# Patient Record
Sex: Female | Born: 1955
Health system: Southern US, Community
[De-identification: ages and names within clinical notes are randomized; demographics above are authoritative.]

## PROBLEM LIST (undated history)

## (undated) HISTORY — PX: COLONOSCOPY: SHX174

## (undated) HISTORY — PX: HAND SURGERY: SHX662

## (undated) HISTORY — PX: OTHER SURGICAL HISTORY: SHX169

---

## 2001-05-11 ENCOUNTER — Other Ambulatory Visit: Admission: RE | Admit: 2001-05-11 | Discharge: 2001-05-11 | Payer: Self-pay | Admitting: Obstetrics and Gynecology

## 2002-07-27 ENCOUNTER — Other Ambulatory Visit: Admission: RE | Admit: 2002-07-27 | Discharge: 2002-07-27 | Payer: Self-pay | Admitting: Obstetrics and Gynecology

## 2003-09-25 ENCOUNTER — Other Ambulatory Visit: Admission: RE | Admit: 2003-09-25 | Discharge: 2003-09-25 | Payer: Self-pay | Admitting: Obstetrics and Gynecology

## 2004-12-10 ENCOUNTER — Other Ambulatory Visit: Admission: RE | Admit: 2004-12-10 | Discharge: 2004-12-10 | Payer: Self-pay | Admitting: Obstetrics and Gynecology

## 2006-10-14 ENCOUNTER — Emergency Department (HOSPITAL_COMMUNITY): Admission: EM | Admit: 2006-10-14 | Discharge: 2006-10-14 | Payer: Self-pay | Admitting: Family Medicine

## 2007-06-08 ENCOUNTER — Ambulatory Visit: Payer: Self-pay | Admitting: Internal Medicine

## 2007-06-22 ENCOUNTER — Encounter: Payer: Self-pay | Admitting: Internal Medicine

## 2007-06-22 ENCOUNTER — Ambulatory Visit: Payer: Self-pay | Admitting: Internal Medicine

## 2007-10-31 ENCOUNTER — Encounter: Admission: RE | Admit: 2007-10-31 | Discharge: 2007-10-31 | Payer: Self-pay | Admitting: Internal Medicine

## 2009-09-24 ENCOUNTER — Ambulatory Visit: Payer: Self-pay | Admitting: Sports Medicine

## 2009-09-24 DIAGNOSIS — M25559 Pain in unspecified hip: Secondary | ICD-10-CM | POA: Insufficient documentation

## 2009-09-24 DIAGNOSIS — M84353A Stress fracture, unspecified femur, initial encounter for fracture: Secondary | ICD-10-CM

## 2009-09-30 ENCOUNTER — Encounter: Admission: RE | Admit: 2009-09-30 | Discharge: 2009-09-30 | Payer: Self-pay | Admitting: Sports Medicine

## 2009-10-01 ENCOUNTER — Ambulatory Visit: Payer: Self-pay | Admitting: Sports Medicine

## 2009-10-15 ENCOUNTER — Ambulatory Visit: Payer: Self-pay | Admitting: Sports Medicine

## 2009-11-05 ENCOUNTER — Ambulatory Visit: Payer: Self-pay | Admitting: Sports Medicine

## 2009-11-22 ENCOUNTER — Ambulatory Visit: Payer: Self-pay | Admitting: Sports Medicine

## 2009-11-22 DIAGNOSIS — R269 Unspecified abnormalities of gait and mobility: Secondary | ICD-10-CM

## 2009-11-22 DIAGNOSIS — M216X9 Other acquired deformities of unspecified foot: Secondary | ICD-10-CM | POA: Insufficient documentation

## 2009-12-24 ENCOUNTER — Ambulatory Visit: Payer: Self-pay | Admitting: Sports Medicine

## 2010-09-09 NOTE — Assessment & Plan Note (Signed)
Summary: F/U RT LEG/[facility]   Vital Signs:  Patient profile:   55 year old female BP sitting:   130 / 77  Vitals Entered By: Enid Baas MD (October 01, 2009 11:01 AM)  History of Present Illness: Patient returns after Dx of Fem neck stress fx by [place] we did and MRI to confirm and this shows compression side, inf fem neck with edema about 20% to 30 % of neck no clear fx line noted no displacement  since starting crutches has reduction of pain by 75% not waking her up no pain on movement of hip now  using vit D, calcium and Vit C uses some advil if pain but not much  Allergies: 1)  ! Pcn  Physical Exam  General:  Well-developed,well-nourished,in no acute distress; alert,appropriate and cooperative throughout examination Msk:  RT hip shows good ROM IR is 25 deg left hip shows seated IR of only 30 deg flex, ext, adduction, ext Rot all against light resistance are pain fress Additional Exam:  MRI reviewed with patient with results as in HPI   Impression & Recommendations:  Problem # 1:  HIP PAIN, RIGHT (ICD-719.45) pain level is much improved cont crutches OK to start NWB yoga OK to try swimming begin easy ROM for hip x no resistance  Problem # 2:  STRESS FRACTURE OF FEMORAL NECK (ICD-733.96) will need 6 full wks NWB cont crutches REck in 2 weeks at that point will start bike if no pain

## 2010-09-09 NOTE — Assessment & Plan Note (Signed)
Summary: F/U,[facility]   Vital Signs:  Patient profile:   55 year old female BP sitting:   127 / 83  Vitals Entered By: Lillia Pauls CMA (Dec 24, 2009 10:00 AM)  History of Present Illness: Runs 4 d per wk 3 to 4 miles during wk weekend run is 5 to 8 miles plans half marathon in Sept  no pain in 5K this past wk no trouble w retraining schedule  off crutches now x 1 month for fem neck stress fx  Allergies: 1)  ! Pcn  Physical Exam  General:  Well-developed,well-nourished,in no acute distress; alert,appropriate and cooperative throughout examination Msk:  good alignment  norm Hip ROM RT and LT full strength on testing of both hips RT is = LT  no pain on hop  running form shows excellent correction w orthotics   Impression & Recommendations:  Problem # 1:  STRESS FRACTURE OF FEMORAL NECK (ICD-733.96) seems healed  see instructions for training  Problem # 2:  ABNORMALITY OF GAIT (ICD-781.2) This seems corrected w orthotics and I think this will reduce her risk of sub strss fx to stay in orthotic correction for running  Patient Instructions: 1)  This week 45 mins at 5/ 1 ratio 2)  then OK to train as you wish 3)  when you run make sure easy warmup for 8 to 10 mins 4)  keep pattern same until training for longer run 5)  then - anytime running longer than 90 minutes - alternate this q 2 weeks although you could run a 60 to 70 min run on alternate week 6)  at least 3 times per week keep up strength work for hip and pelvis 7)  reck as needed

## 2010-09-09 NOTE — Letter (Signed)
Summary: Imaging Preauthorization  Imaging Preauthorization   Imported By: Marily Memos 09/25/2009 09:08:44  _____________________________________________________________________  External Attachment:    Type:   Image     Comment:   External Document

## 2010-09-09 NOTE — Assessment & Plan Note (Signed)
Summary: F/U,[facility]   Vital Signs:  Patient profile:   55 year old female BP sitting:   116 / 77  Vitals Entered By: Enid Baas MD (October 15, 2009 10:20 AM)  History of Present Illness: Pt presents for follow-up of right femoral neck stress fracture. She continues to be very compliant by using her crutches at all times to get around her house. She rarely walks to the bathroom without the crutches. She denies any pain symptoms at all including at rest or when bearing weight. She does not need any pain medications at all. She has been taking vitamin D, vitamin C and calcium supplements. She has been doing gentle ROM of her right hip without pain. Overall, she feels very comfortable and is looking forward to getting back to activity.   Allergies: 1)  ! Pcn  Physical Exam  General:  alert and well-developed.   Head:  normocephalic and atraumatic.   Neck:  supple.   Lungs:  normal respiratory effort.   Msk:  Right Hip: Normal inspection. Full ROM with flexion, extension, abduction and adduction without pain Internal rotation and external rotation of 30 degrees without pain No TTP in the groin or trochanteric bursa 5/5 strength with resisted hip flexion and abduction Able to bear weight standing without pain  Left Hip: Normal inspection. Full ROM with flexion, extension, abduction and adduction without pain Internal rotation and external rotation of 30 degrees without pain No TTP in the groin or trochanteric bursa 4/5 strength with resisted hip flexion and abduction Able to bear weight standing without pain   Impression & Recommendations:  Problem # 1:  STRESS FRACTURE OF FEMORAL NECK (ICD-733.96) Assessment Improved 1. Will now start gradual cross training with the bicycle and with swimming. Start with 20 minutes and gradually build by 5 minutes every 2 days as tolerated. If pain, must stop activity. No treadmill or ellipitcal work. Given hip exercises that she can do daily and  can stand without crutches while doing the exercises (hip flexion, abduction, adduction, rotation and extension). No weights.  2. Can continue tylenol as needed for pain 3. Must continue to use crutches at all times for the next 3 weeks except when showering 4. Return in 3 weeks for follow-up ultrasound of the hip.  Problem # 2:  HIP PAIN, RIGHT (ICD-719.45) See above for treatment of stress fracture.   Patient Instructions: 1)  Continue crutches for the next 3 weeks. 2)  It is ok to start stationary bicycling and swimming but build up your time gradually.  3)  We will see you back in 3 weeks. 4)  Please do the hip exercises daily.

## 2010-09-09 NOTE — Assessment & Plan Note (Signed)
Summary: ORTHOTICS,[facility]   Vital Signs:  Patient profile:   55 year old female BP sitting:   126 / 86  Vitals Entered By: Lillia Pauls CMA (November 22, 2009 8:42 AM)  History of Present Illness: Rreports to f/u right femoral neck stress fracture. No pain on ambulation, staionary biking, elliptical, or daily hip exercises. No rest pain. Doing well. Has abstained from running. Reports for orthotic construction.  Allergies: 1)  ! Pcn  Physical Exam  General:  Well-developed,well-nourished,in no acute distress; alert,appropriate and cooperative throughout examination Msk:  HIPS: No ttp. (-) hop test/fulcrum/foot tap. FROM without pain. No pain on right hip impingement maneuver. Still some bilateral weakness (4/5) on ER.  ANKLES/FEET: Mild long arch collapse and excessive pronation on standing. Full ROM.  GAIT: No pain or limp on running.  Brings knees across midline (R>L). Pronation decreased on orthotic insertion.   Extremities:  Equal leg lengths.   Impression & Recommendations:  Problem # 1:  STRESS FRACTURE OF FEMORAL NECK (ICD-733.96)  The patient was fitted for a standard, cushioned, size 7 CAMBRAY orthotic. The orthotic was subsequently heated. The patient was placed on the orthotic blank positioned on the orthotic stand. The patient was positioned in subtalar neutral position in a weight bearing stance. After completion of molding, a stable blue EVA base was applied to the orthotic blank. The blank was ground to a stable position for weight bearing.  - Walk/run intervals on treadmill for 30 mins/day for next 2 weeks. Increase duration to 40 mins/day for 1 week, then 45 mins/day for 1 week. - RTC in 4 weeks.  Orders: Orthotic [employer], each unit [phone])  Problem # 2:  OTHER ACQUIRED DEFORMITY OF ANKLE AND FOOT OTHER (ICD-736.79)  - Orthotics constructed per item #1.  Orders: Orthotic [employer], each unit [phone])  Problem # 3:  ABNORMALITY OF GAIT  (ICD-781.2) she has dynamic genu valgus with this the case and her having developed a femoral stress fx on low running mileage I think she should stay in orthotics permanently  gait is improved once these are placed

## 2010-09-09 NOTE — Assessment & Plan Note (Signed)
Summary: NP WITH R INGUINAL PAIN X 2 WKS   Vital Signs:  Patient profile:   55 year old female Height:      66 inches Weight:      113 pounds BMI:     18.30 BP sitting:   123 / 87  Vitals Entered By: Lillia Pauls CMA (September 24, 2009 8:51 AM)  CC:  right thigh/hip pain.  History of Present Illness: Pt presents today stating that 1 month ago she was doing one of her "normal" runs of about 7-8 miles, near the end of the run she started to feel a pain in the right medial hip and thigh, described as a mild deep soreness. Wasn't excruciating.  After that she cut back on her mileage some but continues to have soreness in that area at the end of runs.  It was bothering her enough that she took a full week off up until this past weekend when she was running a relay race, at the latter part of that race the soreness became unbareable, she says her husband actually had to help her limp off the course.  Now the pain is bothering her even just while walking, hurts both with foot strike and with lifting the leg up.  Also of note, pt started running on treadmill for first time over the past couple of months.  Allergies (verified): 1)  ! 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 Neuro:  Denies numbness and weakness.  Physical Exam  General:  thin, healthy, visibly limping on the right side while coming into office Additional Exam:  MSK [place] Scanning of RT hip from ant position there appears to be an irregularity of the ant compression side of femoral neck joint and fem head look normal doppler flow is slightly increased but not impression no true edema tendons appear intact scan of left hip does not show same finding  Suspeicious scan for RT fem neck Stress fx   Hip Exam  Gait:    limping  on the right  Sensory:    Gross coordination and sensation were normal.  Motor:    Motor strength 5/5 bilaterally for quadriceps, hamstrings, ankle dorsiflexion, ankle plantar flexion, .  Hip Exam:    Right:    Inspection:  Normal    Palpation:  Normal    Stability:  stable    Tenderness:  no    pt has mild limitation in internal rotation compared to left, she has reproduction of pain with both internal and external rotation    Range of Motion:       Flexion-Active: 140       Internal Rotation-Active: 50       Flexion-Passive: 140       Internal Rotation-Passive: 60    Left:    Inspection:  Normal    Palpation:  Normal    Also has deep soreness of the medial thigh/hip with shaft test on the right   Impression & Recommendations:  Problem # 1:  STRESS FRACTURE OF FEMORAL NECK (ICD-733.96)  In office diagnostic ultrasound shows significant abnormality of right femoral neck consistent with stress fracture with callus formation Will order MRI to assess extent of fracture Crutches for 6 weeks  Orders: Crutches-[facility] ([id number])  Problem # 2:  HIP PAIN, RIGHT (ICD-719.45)  Orders: Radiology other (Radiology Other) MRI without Contrast (MRI w/o Contrast) Crutches-[facility] ([id number])   This is highly suspicious of stress fx although the possibility of soft tissue injury is still there RT hip films today  Patient Instructions: 1)  MRI AND HIP X-RAY ARE [facility], FEB 16TH AT [facility]. APPT TIME IS 12:30 AND THE [address] LOCATION. [phone]

## 2010-09-09 NOTE — Assessment & Plan Note (Signed)
Summary: F/U,[facility]   History of Present Illness: Feb 13 ran race and had intense groin pain we diagnosed s fem neck stress fx seen both on [place] and then confirmed on MRI mileage had only been 15 to 20 she had been doing 1 month of TM which was different since it was very cold no shoe change no menstruation in 2 years BMD was OK in Nov 2010  running only 2 yrs before this  Allergies: 1)  ! Pcn  Physical Exam  General:  Well-developed,well-nourished,in no acute distress; alert,appropriate and cooperative throughout examination Msk:  Hip rotational ROM is normal and equal bilat  good strength on flex/ abd aand adduction; rotation no pain with strength testing on RT  hip loading no pain  gait sans limp  hop test neg Additional Exam:  MSK [place]  Scan of femoral neck is competed Area of stress fx is well visualiized and there is nice thick callus and cortical thickening on one few small hematoma cap remains no abnl doppler flow  images saved   Impression & Recommendations:  Problem # 1:  HIP PAIN, RIGHT (ICD-719.45)  pain resolved will stop crutches today  Orders: [place] LIMITED ([id number])  Problem # 2:  STRESS FRACTURE OF FEMORAL NECK (ICD-733.96)  clinically healed and by MSK [place] is 90 % healed  will progress as per instructions/ reck 2 wks  will need orthotics since Stress fx on < 20 mpw  Orders: [place] LIMITED ([id number])  Patient Instructions: 1)  OK to progress to no crutches 2)  Try elliptical and some Treadmill 3)  first week 30 mins 4)  second week up to 45 mins 5)  Biking is OK - try to keep at least 2 sessions per week 6)  also keep up hip exercises 7)  reck in 2 weeks 8)  we will make orthotics at that time 9)  bring all running shoes

## 2011-07-07 ENCOUNTER — Other Ambulatory Visit: Payer: Self-pay | Admitting: Obstetrics and Gynecology

## 2013-09-27 ENCOUNTER — Other Ambulatory Visit: Payer: Self-pay | Admitting: Obstetrics and Gynecology

## 2014-10-09 ENCOUNTER — Other Ambulatory Visit: Payer: Self-pay | Admitting: Obstetrics and Gynecology

## 2014-10-12 LAB — CYTOLOGY - PAP

## 2014-10-17 ENCOUNTER — Other Ambulatory Visit: Payer: Self-pay | Admitting: Obstetrics and Gynecology

## 2014-10-18 LAB — CYTOLOGY - PAP

## 2015-11-06 ENCOUNTER — Other Ambulatory Visit: Payer: Self-pay | Admitting: Obstetrics and Gynecology

## 2015-11-08 LAB — CYTOLOGY - PAP

## 2016-04-21 DIAGNOSIS — M859 Disorder of bone density and structure, unspecified: Secondary | ICD-10-CM | POA: Diagnosis not present

## 2016-05-11 DIAGNOSIS — R8299 Other abnormal findings in urine: Secondary | ICD-10-CM | POA: Diagnosis not present

## 2016-05-11 DIAGNOSIS — N390 Urinary tract infection, site not specified: Secondary | ICD-10-CM | POA: Diagnosis not present

## 2016-05-11 DIAGNOSIS — M859 Disorder of bone density and structure, unspecified: Secondary | ICD-10-CM | POA: Diagnosis not present

## 2016-05-11 DIAGNOSIS — E784 Other hyperlipidemia: Secondary | ICD-10-CM | POA: Diagnosis not present

## 2016-05-14 DIAGNOSIS — Z1389 Encounter for screening for other disorder: Secondary | ICD-10-CM | POA: Diagnosis not present

## 2016-05-14 DIAGNOSIS — M859 Disorder of bone density and structure, unspecified: Secondary | ICD-10-CM | POA: Diagnosis not present

## 2016-05-14 DIAGNOSIS — G4709 Other insomnia: Secondary | ICD-10-CM | POA: Diagnosis not present

## 2016-05-14 DIAGNOSIS — E784 Other hyperlipidemia: Secondary | ICD-10-CM | POA: Diagnosis not present

## 2016-05-14 DIAGNOSIS — K219 Gastro-esophageal reflux disease without esophagitis: Secondary | ICD-10-CM | POA: Diagnosis not present

## 2016-05-14 DIAGNOSIS — Z0000 Encounter for general adult medical examination without abnormal findings: Secondary | ICD-10-CM | POA: Diagnosis not present

## 2016-05-15 DIAGNOSIS — Z1212 Encounter for screening for malignant neoplasm of rectum: Secondary | ICD-10-CM | POA: Diagnosis not present

## 2017-03-11 DIAGNOSIS — Z681 Body mass index (BMI) 19 or less, adult: Secondary | ICD-10-CM | POA: Diagnosis not present

## 2017-03-11 DIAGNOSIS — Z124 Encounter for screening for malignant neoplasm of cervix: Secondary | ICD-10-CM | POA: Diagnosis not present

## 2017-03-11 DIAGNOSIS — Z1231 Encounter for screening mammogram for malignant neoplasm of breast: Secondary | ICD-10-CM | POA: Diagnosis not present

## 2017-03-11 DIAGNOSIS — Z01419 Encounter for gynecological examination (general) (routine) without abnormal findings: Secondary | ICD-10-CM | POA: Diagnosis not present

## 2017-05-18 DIAGNOSIS — E7849 Other hyperlipidemia: Secondary | ICD-10-CM | POA: Diagnosis not present

## 2017-05-18 DIAGNOSIS — M859 Disorder of bone density and structure, unspecified: Secondary | ICD-10-CM | POA: Diagnosis not present

## 2017-05-25 DIAGNOSIS — K219 Gastro-esophageal reflux disease without esophagitis: Secondary | ICD-10-CM | POA: Diagnosis not present

## 2017-05-25 DIAGNOSIS — Z0000 Encounter for general adult medical examination without abnormal findings: Secondary | ICD-10-CM | POA: Diagnosis not present

## 2017-05-25 DIAGNOSIS — M8580 Other specified disorders of bone density and structure, unspecified site: Secondary | ICD-10-CM | POA: Diagnosis not present

## 2017-05-25 DIAGNOSIS — Z1389 Encounter for screening for other disorder: Secondary | ICD-10-CM | POA: Diagnosis not present

## 2017-05-25 DIAGNOSIS — E7849 Other hyperlipidemia: Secondary | ICD-10-CM | POA: Diagnosis not present

## 2017-05-25 DIAGNOSIS — M898X1 Other specified disorders of bone, shoulder: Secondary | ICD-10-CM | POA: Diagnosis not present

## 2017-05-25 DIAGNOSIS — Z23 Encounter for immunization: Secondary | ICD-10-CM | POA: Diagnosis not present

## 2017-05-31 DIAGNOSIS — Z1212 Encounter for screening for malignant neoplasm of rectum: Secondary | ICD-10-CM | POA: Diagnosis not present

## 2017-06-15 ENCOUNTER — Encounter: Payer: Self-pay | Admitting: Internal Medicine

## 2017-07-09 ENCOUNTER — Encounter: Payer: Self-pay | Admitting: Internal Medicine

## 2017-08-18 ENCOUNTER — Other Ambulatory Visit: Payer: Self-pay

## 2017-08-18 ENCOUNTER — Ambulatory Visit (AMBULATORY_SURGERY_CENTER): Payer: Self-pay | Admitting: *Deleted

## 2017-08-18 VITALS — Ht 66.0 in | Wt 124.0 lb

## 2017-08-18 DIAGNOSIS — Z1211 Encounter for screening for malignant neoplasm of colon: Secondary | ICD-10-CM

## 2017-08-18 MED ORDER — NA SULFATE-K SULFATE-MG SULF 17.5-3.13-1.6 GM/177ML PO SOLN: 1.0000 | Freq: Once | ORAL | 0 refills | Status: AC

## 2017-08-18 NOTE — Progress Notes (Signed)
No egg or soy allergy known to patient  No issues with past sedation with any surgeries  or procedures, no intubation problems  No diet pills per patient No home 02 use per patient  No blood thinners per patient  Pt denies issues with constipation  No A fib or A flutter  EMMI video sent to pt's e mail - Prep choices discussed with pt- costs also discussed- she decided on suprep- sent $15 on line coupon with pt, she is aware she will have a co pay and to call with anu issues or questions

## 2017-08-26 ENCOUNTER — Encounter: Payer: Self-pay | Admitting: Internal Medicine

## 2017-09-01 ENCOUNTER — Ambulatory Visit (AMBULATORY_SURGERY_CENTER): Payer: BLUE CROSS/BLUE SHIELD | Admitting: Internal Medicine

## 2017-09-01 ENCOUNTER — Encounter: Payer: Self-pay | Admitting: Internal Medicine

## 2017-09-01 ENCOUNTER — Other Ambulatory Visit: Payer: Self-pay

## 2017-09-01 VITALS — BP 111/72 | HR 54 | Temp 97.8°F | Resp 10 | Ht 66.0 in | Wt 124.0 lb

## 2017-09-01 DIAGNOSIS — K635 Polyp of colon: Secondary | ICD-10-CM

## 2017-09-01 DIAGNOSIS — Z1211 Encounter for screening for malignant neoplasm of colon: Secondary | ICD-10-CM

## 2017-09-01 DIAGNOSIS — Z1212 Encounter for screening for malignant neoplasm of rectum: Secondary | ICD-10-CM

## 2017-09-01 DIAGNOSIS — D125 Benign neoplasm of sigmoid colon: Secondary | ICD-10-CM

## 2017-09-01 MED ORDER — SODIUM CHLORIDE 0.9 % IV SOLN: 500.0000 mL | Freq: Once | INTRAVENOUS | Status: AC

## 2017-09-01 NOTE — Patient Instructions (Signed)
   INFORMATION ON POLYPS & DIVERTICULOSIS GIVEN TO YOU TODAY  AWAIT PATHOLOGY RESULT ON POLYP REMOVED    YOU HAD AN ENDOSCOPIC PROCEDURE TODAY AT [facility]:   Refer to the procedure report that was given to you for any specific questions about what was found during the examination.  If the procedure report does not answer your questions, please call your gastroenterologist to clarify.  If you requested that your care partner not be given the details of your procedure findings, then the procedure report has been included in a sealed envelope for you to review at your convenience later.  YOU SHOULD EXPECT: Some feelings of bloating in the abdomen. Passage of more gas than usual.  Walking can help get rid of the air that was put into your GI tract during the procedure and reduce the bloating. If you had a lower endoscopy (such as a colonoscopy or flexible sigmoidoscopy) you may notice spotting of blood in your stool or on the toilet paper. If you underwent a bowel prep for your procedure, you may not have a normal bowel movement for a few days.  Please Note:  You might notice some irritation and congestion in your nose or some drainage.  This is from the oxygen used during your procedure.  There is no need for concern and it should clear up in a day or so.  SYMPTOMS TO REPORT IMMEDIATELY:   Following lower endoscopy (colonoscopy or flexible sigmoidoscopy):  Excessive amounts of blood in the stool  Significant tenderness or worsening of abdominal pains  Swelling of the abdomen that is new, acute  Fever of 100F or higher    For urgent or emergent issues, a gastroenterologist can be reached at any hour by calling [phone].   DIET:  We do recommend a small meal at first, but then you may proceed to your regular diet.  Drink plenty of fluids but you should avoid alcoholic beverages for 24 hours.  ACTIVITY:  You should plan to take it easy for the rest of today and you  should NOT DRIVE or use heavy machinery until tomorrow (because of the sedation medicines used during the test).    FOLLOW UP: Our staff will call the number listed on your records the next business day following your procedure to check on you and address any questions or concerns that you may have regarding the information given to you following your procedure. If we do not reach you, we will leave a message.  However, if you are feeling well and you are not experiencing any problems, there is no need to return our call.  We will assume that you have returned to your regular daily activities without incident.  If any biopsies were taken you will be contacted by phone or by letter within the next 1-3 weeks.  Please call us at [phone] if you have not heard about the biopsies in 3 weeks.    SIGNATURES/CONFIDENTIALITY: You and/or your care partner have signed paperwork which will be entered into your electronic medical record.  These signatures attest to the fact that that the information above on your After Visit Summary has been reviewed and is understood.  Full responsibility of the confidentiality of this discharge information lies with you and/or your care-partner.

## 2017-09-01 NOTE — Progress Notes (Signed)
No changes in medical or surgical since PV per

## 2017-09-01 NOTE — Op Note (Signed)
[facility] Patient Name: Gwendolyn Herring Procedure Date: 09/01/2017 9:32 AM MRN: [id number] Endoscopist: Docia Chuck. Henrene Pastor , MD Age: 62 Referring MD:  Date of Birth: 1955-09-07 Gender: Female Account #: [id number] Procedure:                Colonoscopy, with cold snare polypectomy x 1 Indications:              Screening for colorectal malignant neoplasm. Index                            exam 2008 was negative for neoplasia Medicines:                Monitored Anesthesia Care Procedure:                Pre-Anesthesia Assessment:                           - Prior to the procedure, a History and Physical                            was performed, and patient medications and                            allergies were reviewed. The patient's tolerance of                            previous anesthesia was also reviewed. The risks                            and benefits of the procedure and the sedation                            options and risks were discussed with the patient.                            All questions were answered, and informed consent                            was obtained. Prior Anticoagulants: The patient has                            taken no previous anticoagulant or antiplatelet                            agents. ASA Grade Assessment: II - A patient with                            mild systemic disease. After reviewing the risks                            and benefits, the patient was deemed in                            satisfactory condition to undergo the procedure.  After obtaining informed consent, the colonoscope                            was passed under direct vision. Throughout the                            procedure, the patient's blood pressure, pulse, and                            oxygen saturations were monitored continuously. The                            Colonoscope was introduced through the anus and         advanced to the the cecum, identified by                            appendiceal orifice and ileocecal valve. The                            ileocecal valve, appendiceal orifice, and rectum                            were photographed. The quality of the bowel                            preparation was excellent. The colonoscopy was                            performed without difficulty. The patient tolerated                            the procedure well. The bowel preparation used was                            SUPREP. Scope In: 9:37:03 AM Scope Out: 10:04:01 AM Scope Withdrawal Time: 0 hours 15 minutes 37 seconds  Total Procedure Duration: 0 hours 26 minutes 58 seconds  Findings:                 A 3 mm polyp was found in the sigmoid colon. The                            polyp was removed with a cold snare. Resection and                            retrieval were complete.                           A single wide mouth diverticulum was noted in the                            ascending colon and a few small-mouthed diverticula  were found in the sigmoid colon.                           The exam was otherwise without abnormality on                            direct and retroflexion views. Complications:            No immediate complications. Estimated blood loss:                            None. Estimated Blood Loss:     Estimated blood loss: none. Impression:               - One 3 mm polyp in the sigmoid colon, removed with                            a cold snare. Resected and retrieved.                           - Diverticulosis.                           - The examination was otherwise normal on direct                            and retroflexion views. Recommendation:           - Repeat colonoscopy in 5-10 years for surveillance.                           - Patient has a contact number available for                            emergencies. The signs and  symptoms of potential                            delayed complications were discussed with the                            patient. Return to normal activities tomorrow.                            Written discharge instructions were provided to the                            patient.                           - Resume previous diet.                           - Continue present medications.                           - Await pathology results. Docia Chuck. Henrene Pastor, MD 09/01/2017 10:10:06 AM This report has been signed electronically.

## 2017-09-01 NOTE — Progress Notes (Signed)
Called to room to assist during endoscopic procedure.  Patient ID and intended procedure confirmed with present staff. Received instructions for my participation in the procedure from the performing physician.  

## 2017-09-01 NOTE — Progress Notes (Signed)
To recovery, report to RN, VSS. 

## 2017-09-02 ENCOUNTER — Telehealth: Payer: Self-pay | Admitting: *Deleted

## 2017-09-02 ENCOUNTER — Telehealth: Payer: Self-pay

## 2017-09-02 NOTE — Telephone Encounter (Signed)
  Follow up Call-  Call back number 09/01/2017  Post procedure Call Back phone  # [phone]  Permission to leave phone message Yes  Some recent data might be hidden     Left message

## 2017-09-02 NOTE — Telephone Encounter (Signed)
  Follow up Call-  Call back number 09/01/2017  Post procedure Call Back phone  # [phone]  Permission to leave phone message Yes  Some recent data might be hidden     Patient questions:  Do you have a fever, pain , or abdominal swelling? No. Pain Score  0 *  Have you tolerated food without any problems? Yes.    Have you been able to return to your normal activities? Yes.    Do you have any questions about your discharge instructions: Diet   No. Medications  No. Follow up visit  No.  Do you have questions or concerns about your Care? No.  Actions: * If pain score is 4 or above: No action needed, pain <4.

## 2017-09-06 ENCOUNTER — Encounter: Payer: Self-pay | Admitting: Internal Medicine

## 2018-04-23 DIAGNOSIS — Z23 Encounter for immunization: Secondary | ICD-10-CM | POA: Diagnosis not present

## 2018-05-24 DIAGNOSIS — E7849 Other hyperlipidemia: Secondary | ICD-10-CM | POA: Diagnosis not present

## 2018-05-24 DIAGNOSIS — M859 Disorder of bone density and structure, unspecified: Secondary | ICD-10-CM | POA: Diagnosis not present

## 2018-05-24 DIAGNOSIS — Z0000 Encounter for general adult medical examination without abnormal findings: Secondary | ICD-10-CM | POA: Diagnosis not present

## 2018-05-31 DIAGNOSIS — Z1389 Encounter for screening for other disorder: Secondary | ICD-10-CM | POA: Diagnosis not present

## 2018-05-31 DIAGNOSIS — M84359A Stress fracture, hip, unspecified, initial encounter for fracture: Secondary | ICD-10-CM | POA: Diagnosis not present

## 2018-05-31 DIAGNOSIS — Z23 Encounter for immunization: Secondary | ICD-10-CM | POA: Diagnosis not present

## 2018-05-31 DIAGNOSIS — Z0000 Encounter for general adult medical examination without abnormal findings: Secondary | ICD-10-CM | POA: Diagnosis not present

## 2018-05-31 DIAGNOSIS — K219 Gastro-esophageal reflux disease without esophagitis: Secondary | ICD-10-CM | POA: Diagnosis not present

## 2018-05-31 DIAGNOSIS — M859 Disorder of bone density and structure, unspecified: Secondary | ICD-10-CM | POA: Diagnosis not present

## 2018-05-31 DIAGNOSIS — E7849 Other hyperlipidemia: Secondary | ICD-10-CM | POA: Diagnosis not present

## 2018-06-03 DIAGNOSIS — Z1212 Encounter for screening for malignant neoplasm of rectum: Secondary | ICD-10-CM | POA: Diagnosis not present

## 2018-06-29 DIAGNOSIS — Z1231 Encounter for screening mammogram for malignant neoplasm of breast: Secondary | ICD-10-CM | POA: Diagnosis not present

## 2018-06-29 DIAGNOSIS — Z01419 Encounter for gynecological examination (general) (routine) without abnormal findings: Secondary | ICD-10-CM | POA: Diagnosis not present

## 2018-06-29 DIAGNOSIS — Z6820 Body mass index (BMI) 20.0-20.9, adult: Secondary | ICD-10-CM | POA: Diagnosis not present

## 2018-06-29 DIAGNOSIS — Z124 Encounter for screening for malignant neoplasm of cervix: Secondary | ICD-10-CM | POA: Diagnosis not present

## 2019-04-04 DIAGNOSIS — Z23 Encounter for immunization: Secondary | ICD-10-CM | POA: Diagnosis not present

## 2019-04-22 DIAGNOSIS — Z23 Encounter for immunization: Secondary | ICD-10-CM | POA: Diagnosis not present

## 2019-05-31 DIAGNOSIS — M859 Disorder of bone density and structure, unspecified: Secondary | ICD-10-CM | POA: Diagnosis not present

## 2019-05-31 DIAGNOSIS — E7849 Other hyperlipidemia: Secondary | ICD-10-CM | POA: Diagnosis not present

## 2019-05-31 DIAGNOSIS — Z0000 Encounter for general adult medical examination without abnormal findings: Secondary | ICD-10-CM | POA: Diagnosis not present

## 2019-06-07 DIAGNOSIS — M8580 Other specified disorders of bone density and structure, unspecified site: Secondary | ICD-10-CM | POA: Diagnosis not present

## 2019-06-07 DIAGNOSIS — Z0000 Encounter for general adult medical examination without abnormal findings: Secondary | ICD-10-CM | POA: Diagnosis not present

## 2019-06-07 DIAGNOSIS — Z1331 Encounter for screening for depression: Secondary | ICD-10-CM | POA: Diagnosis not present

## 2019-06-07 DIAGNOSIS — E785 Hyperlipidemia, unspecified: Secondary | ICD-10-CM | POA: Diagnosis not present

## 2019-06-08 DIAGNOSIS — K921 Melena: Secondary | ICD-10-CM | POA: Diagnosis not present

## 2019-06-20 DIAGNOSIS — Z23 Encounter for immunization: Secondary | ICD-10-CM | POA: Diagnosis not present

## 2019-09-11 DIAGNOSIS — M8580 Other specified disorders of bone density and structure, unspecified site: Secondary | ICD-10-CM | POA: Diagnosis not present

## 2019-12-19 DIAGNOSIS — Z681 Body mass index (BMI) 19 or less, adult: Secondary | ICD-10-CM | POA: Diagnosis not present

## 2019-12-19 DIAGNOSIS — Z1231 Encounter for screening mammogram for malignant neoplasm of breast: Secondary | ICD-10-CM | POA: Diagnosis not present

## 2019-12-19 DIAGNOSIS — Z01419 Encounter for gynecological examination (general) (routine) without abnormal findings: Secondary | ICD-10-CM | POA: Diagnosis not present

## 2019-12-19 DIAGNOSIS — Z124 Encounter for screening for malignant neoplasm of cervix: Secondary | ICD-10-CM | POA: Diagnosis not present

## 2020-05-02 DIAGNOSIS — Z20822 Contact with and (suspected) exposure to covid-19: Secondary | ICD-10-CM | POA: Diagnosis not present

## 2020-05-11 DIAGNOSIS — Z23 Encounter for immunization: Secondary | ICD-10-CM | POA: Diagnosis not present

## 2020-05-29 DIAGNOSIS — M859 Disorder of bone density and structure, unspecified: Secondary | ICD-10-CM | POA: Diagnosis not present

## 2020-05-29 DIAGNOSIS — Z0000 Encounter for general adult medical examination without abnormal findings: Secondary | ICD-10-CM | POA: Diagnosis not present

## 2020-05-29 DIAGNOSIS — E785 Hyperlipidemia, unspecified: Secondary | ICD-10-CM | POA: Diagnosis not present

## 2020-06-07 DIAGNOSIS — Z0000 Encounter for general adult medical examination without abnormal findings: Secondary | ICD-10-CM | POA: Diagnosis not present

## 2020-06-07 DIAGNOSIS — Z1389 Encounter for screening for other disorder: Secondary | ICD-10-CM | POA: Diagnosis not present

## 2020-06-07 DIAGNOSIS — Z1331 Encounter for screening for depression: Secondary | ICD-10-CM | POA: Diagnosis not present

## 2020-06-15 DIAGNOSIS — Z23 Encounter for immunization: Secondary | ICD-10-CM | POA: Diagnosis not present

## 2020-07-17 DIAGNOSIS — Z1212 Encounter for screening for malignant neoplasm of rectum: Secondary | ICD-10-CM | POA: Diagnosis not present

## 2021-02-03 DIAGNOSIS — Z1231 Encounter for screening mammogram for malignant neoplasm of breast: Secondary | ICD-10-CM | POA: Diagnosis not present

## 2021-02-03 DIAGNOSIS — Z01419 Encounter for gynecological examination (general) (routine) without abnormal findings: Secondary | ICD-10-CM | POA: Diagnosis not present

## 2021-02-03 DIAGNOSIS — Z124 Encounter for screening for malignant neoplasm of cervix: Secondary | ICD-10-CM | POA: Diagnosis not present

## 2021-02-03 DIAGNOSIS — Z681 Body mass index (BMI) 19 or less, adult: Secondary | ICD-10-CM | POA: Diagnosis not present

## 2021-02-04 DIAGNOSIS — Z124 Encounter for screening for malignant neoplasm of cervix: Secondary | ICD-10-CM | POA: Diagnosis not present

## 2021-02-06 ENCOUNTER — Other Ambulatory Visit: Payer: Self-pay | Admitting: Obstetrics and Gynecology

## 2021-02-06 DIAGNOSIS — R928 Other abnormal and inconclusive findings on diagnostic imaging of breast: Secondary | ICD-10-CM

## 2021-03-03 ENCOUNTER — Ambulatory Visit: Payer: BC Managed Care – PPO

## 2021-03-03 ENCOUNTER — Other Ambulatory Visit: Payer: Self-pay

## 2021-03-03 ENCOUNTER — Ambulatory Visit
Admission: RE | Admit: 2021-03-03 | Discharge: 2021-03-03 | Disposition: A | Discharge status: Home / Self Care | Payer: BC Managed Care – PPO | Source: Ambulatory Visit | Attending: Obstetrics and Gynecology | Admitting: Obstetrics and Gynecology

## 2021-03-03 DIAGNOSIS — R928 Other abnormal and inconclusive findings on diagnostic imaging of breast: Secondary | ICD-10-CM | POA: Diagnosis not present

## 2021-03-03 DIAGNOSIS — R922 Inconclusive mammogram: Secondary | ICD-10-CM | POA: Diagnosis not present

## 2021-05-10 DIAGNOSIS — Z23 Encounter for immunization: Secondary | ICD-10-CM | POA: Diagnosis not present

## 2021-06-17 DIAGNOSIS — M859 Disorder of bone density and structure, unspecified: Secondary | ICD-10-CM | POA: Diagnosis not present

## 2021-06-17 DIAGNOSIS — E785 Hyperlipidemia, unspecified: Secondary | ICD-10-CM | POA: Diagnosis not present

## 2021-06-24 DIAGNOSIS — E785 Hyperlipidemia, unspecified: Secondary | ICD-10-CM | POA: Diagnosis not present

## 2021-06-24 DIAGNOSIS — Z1339 Encounter for screening examination for other mental health and behavioral disorders: Secondary | ICD-10-CM | POA: Diagnosis not present

## 2021-06-24 DIAGNOSIS — R82998 Other abnormal findings in urine: Secondary | ICD-10-CM | POA: Diagnosis not present

## 2021-06-24 DIAGNOSIS — Z0000 Encounter for general adult medical examination without abnormal findings: Secondary | ICD-10-CM | POA: Diagnosis not present

## 2021-06-24 DIAGNOSIS — Z1331 Encounter for screening for depression: Secondary | ICD-10-CM | POA: Diagnosis not present

## 2021-11-24 IMAGING — MG MM DIGITAL DIAGNOSTIC UNILAT*L* W/ TOMO W/ CAD
4 series · 4 of 12 positions shown · non-contrast
Comparison: Previous exam(s).

CLINICAL DATA: Screening recall for a possible left breast
asymmetry.

EXAM:
DIGITAL DIAGNOSTIC UNILATERAL LEFT MAMMOGRAM WITH TOMOSYNTHESIS AND
CAD
TECHNIQUE: Left digital diagnostic mammography and breast tomosynthesis was
performed. The images were evaluated with computer-aided detection.

[L CC synth-2D]
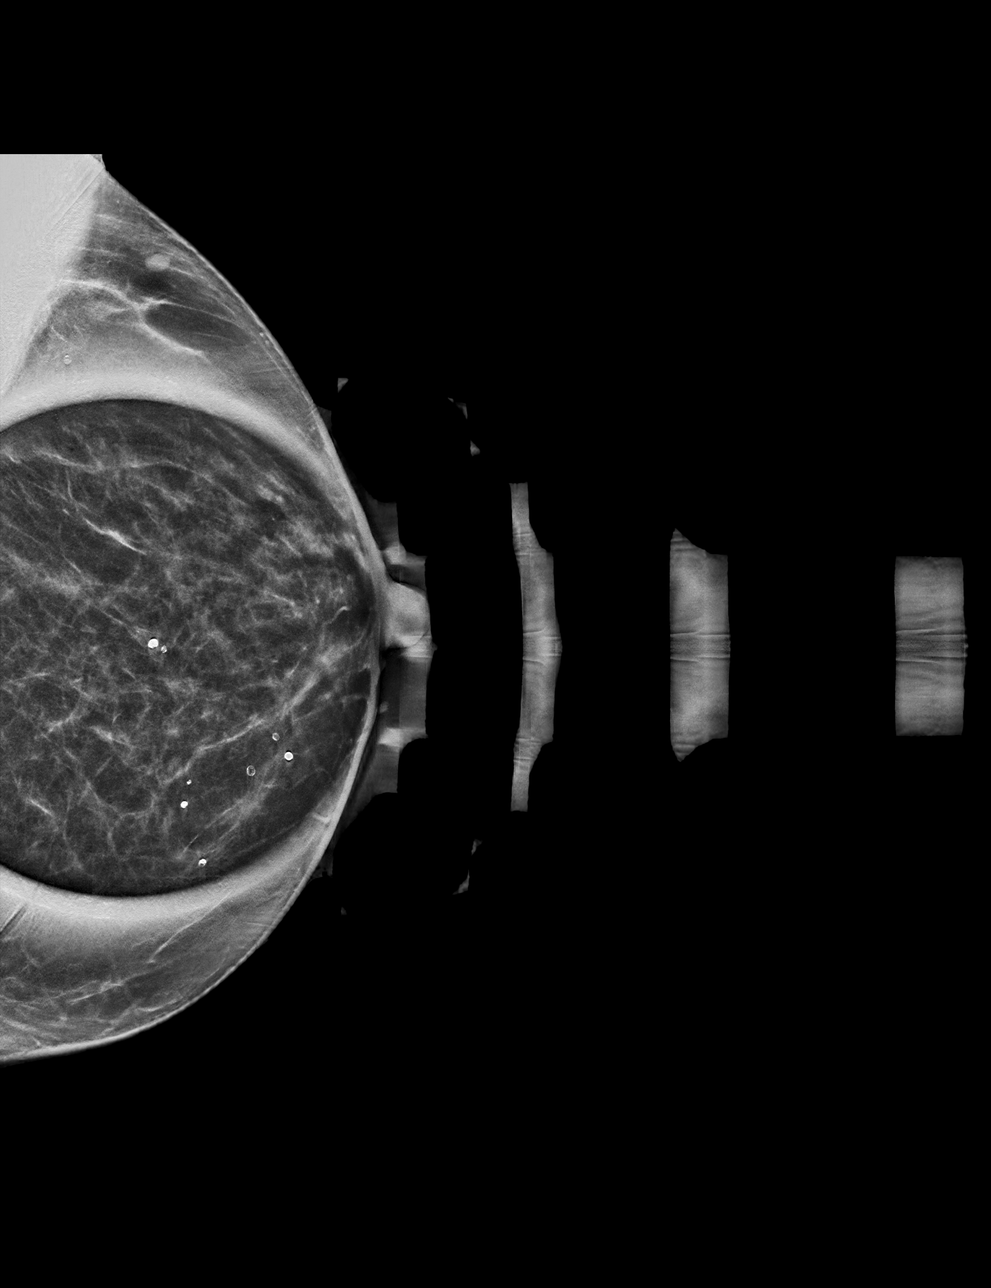

[L ML synth-2D]
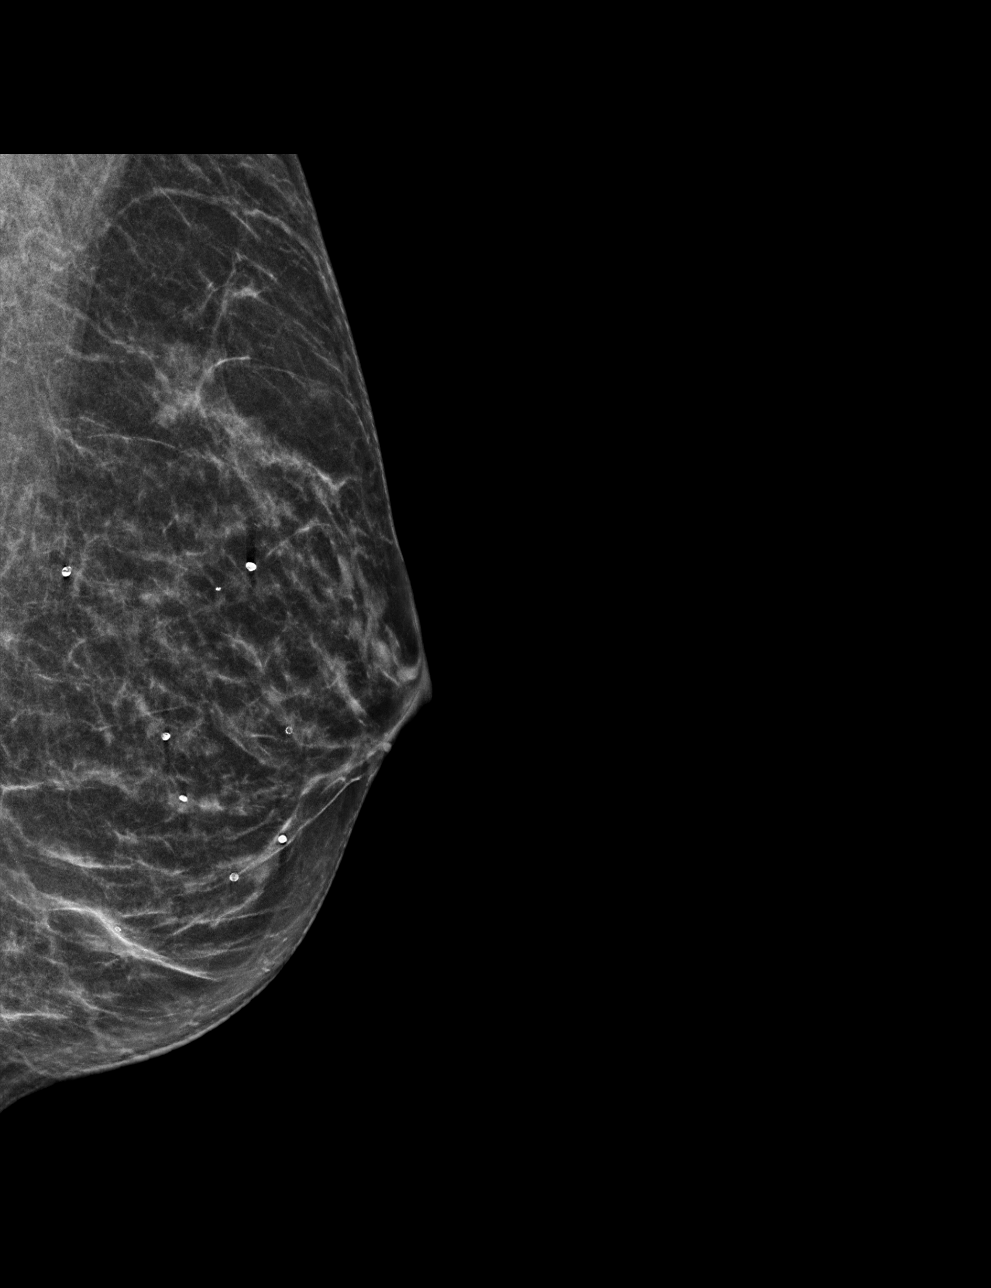

[L ML tomo · tomo slice 24/47.0]
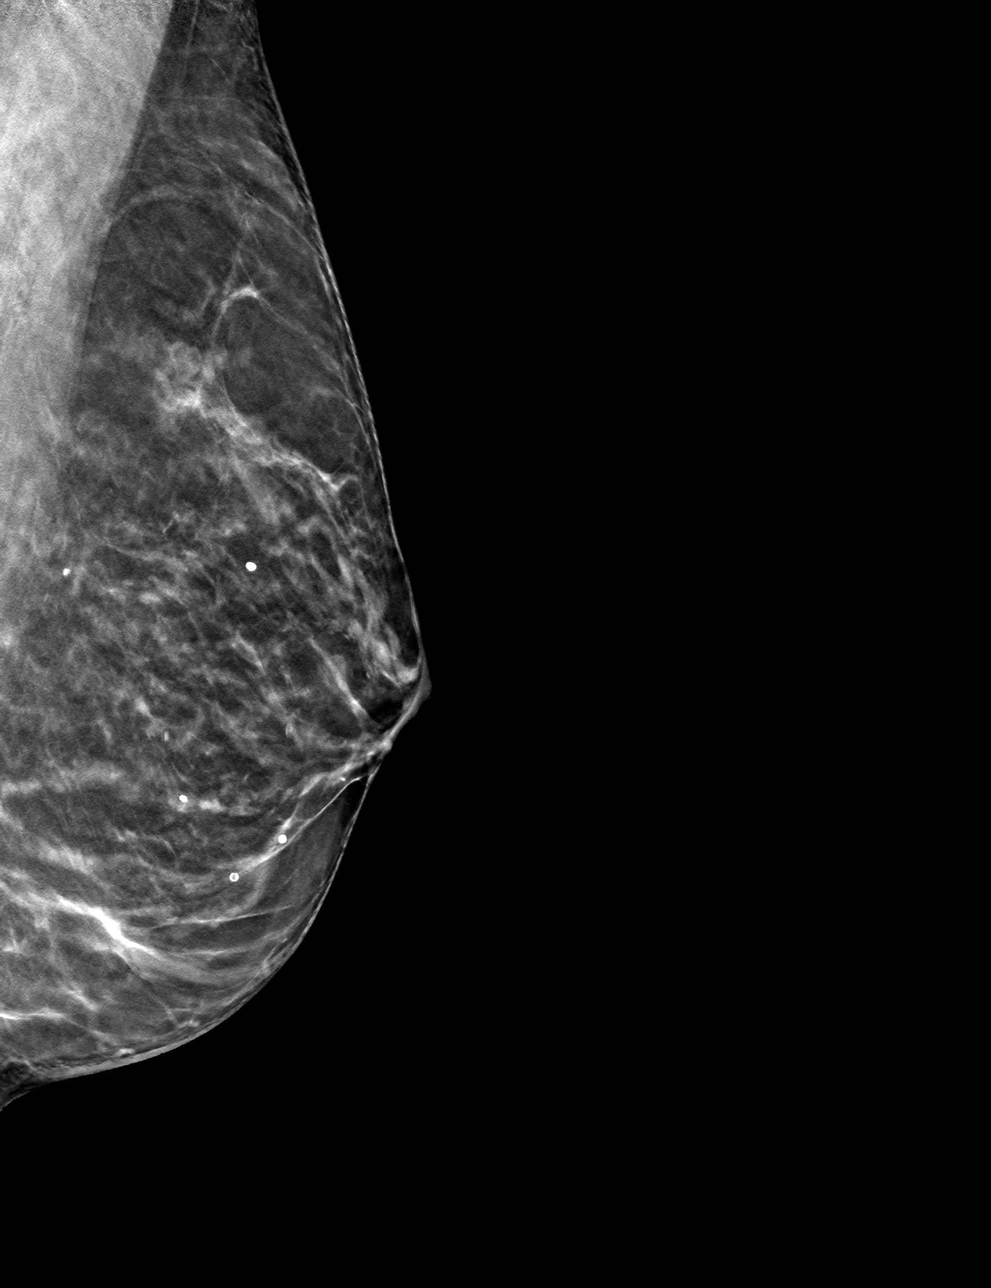

[L CC tomo · tomo slice 26/51.0]
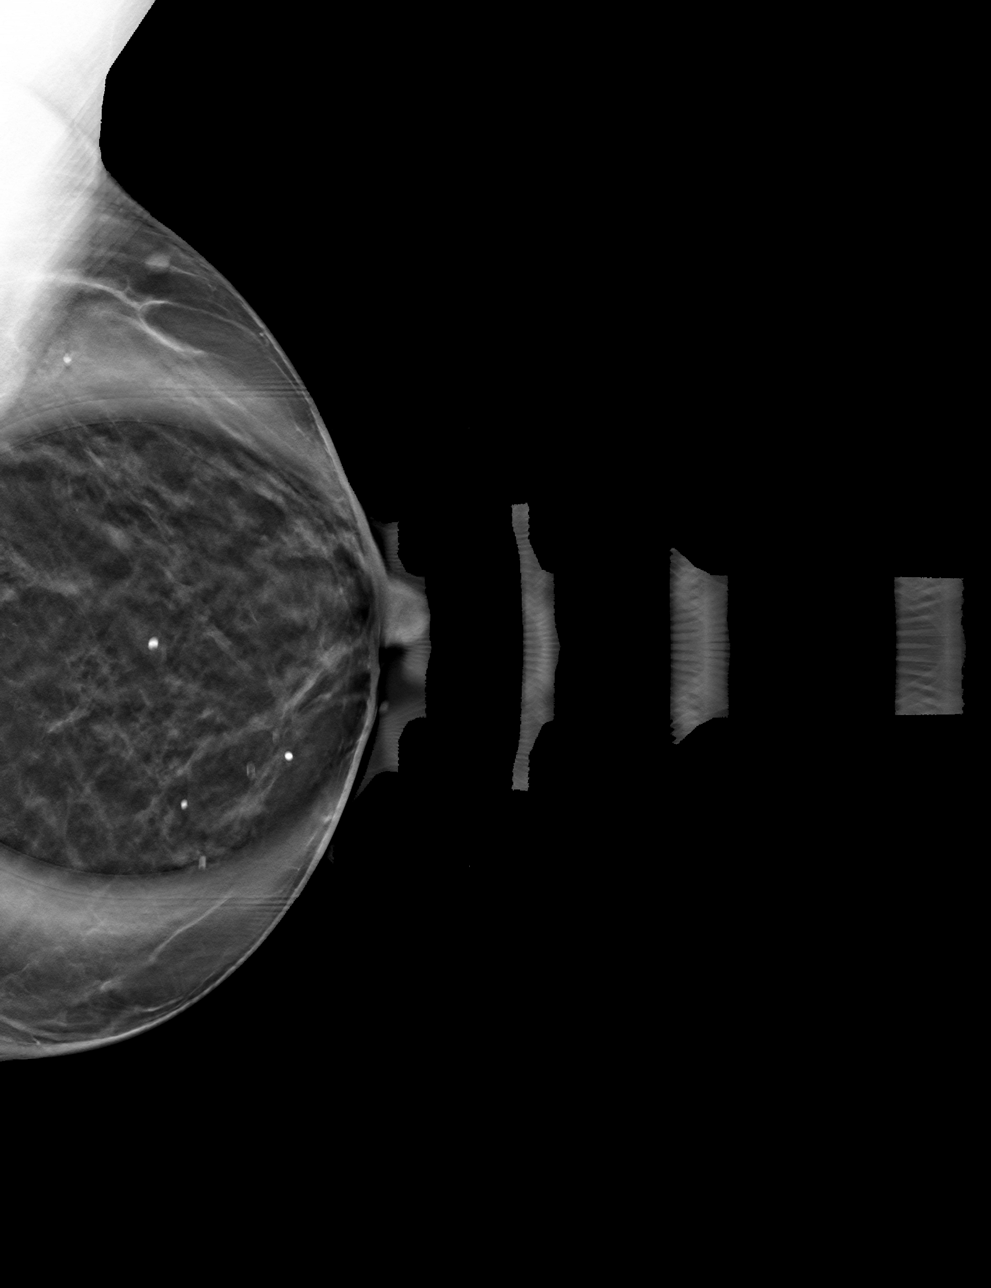

[4 of 12 positions shown; findings below may reference images not displayed]

ACR Breast Density Category b: There are scattered areas of
fibroglandular density.
FINDINGS: The possible asymmetry, noted on the current screening left breast
cc view, disperses with spot compression imaging consistent with
superimposed fibroglandular tissue. There is no underlying mass or
significant residual asymmetry. There are no areas of architectural
distortion and no suspicious calcifications.
IMPRESSION: No evidence of breast malignancy.

RECOMMENDATION:
Screening mammogram in one year.(Code:[id number])

I have discussed the findings and recommendations with the patient.
If applicable, a reminder letter will be sent to the patient
regarding the next appointment.

BI-RADS CATEGORY  1: Negative.

## 2022-08-18 ENCOUNTER — Other Ambulatory Visit: Payer: Self-pay | Admitting: Internal Medicine

## 2022-08-18 DIAGNOSIS — E785 Hyperlipidemia, unspecified: Secondary | ICD-10-CM

## 2022-09-17 ENCOUNTER — Ambulatory Visit
Admission: RE | Admit: 2022-09-17 | Discharge: 2022-09-17 | Disposition: A | Discharge status: Home / Self Care | Payer: Medicare Other | Source: Ambulatory Visit | Attending: Internal Medicine | Admitting: Internal Medicine

## 2022-09-17 DIAGNOSIS — E785 Hyperlipidemia, unspecified: Secondary | ICD-10-CM

## 2023-03-17 ENCOUNTER — Other Ambulatory Visit: Payer: Self-pay | Admitting: Internal Medicine

## 2023-03-17 DIAGNOSIS — R911 Solitary pulmonary nodule: Secondary | ICD-10-CM

## 2023-03-29 ENCOUNTER — Ambulatory Visit
Admission: RE | Admit: 2023-03-29 | Discharge: 2023-03-29 | Disposition: A | Discharge status: Home / Self Care | Payer: Medicare Other | Source: Ambulatory Visit | Attending: Internal Medicine | Admitting: Internal Medicine

## 2023-03-29 DIAGNOSIS — R911 Solitary pulmonary nodule: Secondary | ICD-10-CM

## 2023-03-29 MED ORDER — IOPAMIDOL (ISOVUE-300) INJECTION 61%
500.0000 mL | Freq: Once | INTRAVENOUS | Status: AC | PRN
  Administered 2023-03-29: 75 mL via INTRAVENOUS

## 2023-09-15 ENCOUNTER — Other Ambulatory Visit (HOSPITAL_BASED_OUTPATIENT_CLINIC_OR_DEPARTMENT_OTHER): Payer: Self-pay

## 2023-09-15 MED ORDER — AREXVY 120 MCG/0.5ML IM SUSR: INTRAMUSCULAR | 0 refills | Status: AC
